# Patient Record
Sex: Female | Born: 1960 | Hispanic: Yes
Health system: Southern US, Community
[De-identification: ages and names within clinical notes are randomized; demographics above are authoritative.]

---

## 2015-03-02 ENCOUNTER — Inpatient Hospital Stay: Payer: Self-pay | Admitting: Cardiology

## 2015-04-29 NOTE — Discharge Summary (Signed)
PATIENT NAME:  [name] Todd, [name] MR#:  [id number] DATE OF BIRTH:  1961/06/29  DATE OF ADMISSION:  03/02/2015 DATE OF DISCHARGE:  03/04/2015  PR[facility]ARE [facility]YSIC[facility].  FINAL DIAGNOSIS: Inferior ST-elevation myocardial infarction.  DISCHARGE MEDICATIONS: Included aspirin 325 mg daily, clopidogrel 75 mg daily, lisinopril 5 mg daily, Simvastatin 20 mg at bedtime, metoprolol succinate 50 mg daily, nitroglycerin 0.4 mg sublingual p.r.n.   PROCEDURES: Cardiac catheterization with selective coronary arteriography on 03/02/2015.   HISTORY OF PRESENT ILLNESS: The please see admission H and P.   HOSPITAL COURSE: The patient [facility] Room on 03/02/2015, with substernal chest pain. EKG revealed ST elevations in the inferior leads consistent with ST-elevation myocardial infarction. The patient underwent urgent cardiac catheterization which revealed insignificant coronary artery disease with a near 60% tubular stenosis in the mid to distal segment of a small caliber posterior descending artery. The patient was treated medically. She ruled in for myocardial infarction with a troponin of 13. The patient was treated with a heparin drip overnight and transferred to telemetry on 03/03/2015. Following cardiac catheterization, the patient had no recurrent chest pain. Echocardiogram was performed, which revealed preserved left ventricular function with LV ejection fraction greater than 55% without significant valvular abnormalities. The patient ambulated in the halls without difficulty. On the morning of 03/04/2015, the patient was discharged home.    ____________________________ Marcina MillardAlexander Wister Hoefle, MD ap:jh D: 03/04/2015 08:56:08 ET T: 03/04/2015 12:53:24 ET JOB#: [id number]  cc: Marcina MillardAlexander Dezman Granda, MD, <Dictator> Marcina MillardALEXANDER Lillieanna Tuohy MD ELECTRONICALLY SIGNED 03/06/2015 10:09

## 2015-04-29 NOTE — H&P (Signed)
PATIENT NAME:  [name] Todd, [name] Todd MR#:  [id number] DATE OF BIRTH:  1961-04-15  DATE OF ADMISSION:  03/02/2015  PRIMARY CARE PHYSICIAN: [facility].  CHIEF COMPLAINT: Chest pain.   HISTORY OF PRESENT ILLNESS: The patient is a 54 year old female, who was in her usual state of health until today when she experienced chest pain. The patient describes substernal chest discomfort, feels like tightness or heaviness with radiation down her right arm. She experienced chest pain while driving to work. At work, the EMS was called, and initial EKG revealed ST elevation in inferior leads, consistent with ST elevation myocardial infarction. The patient was [facility] where she was still experiencing chest pain.   PAST MEDICAL HISTORY: None.   MEDICATIONS: Herbal supplements.   SOCIAL HISTORY: The patient is married. She denies tobacco abuse.   FAMILY HISTORY: No family history for coronary artery disease or myocardial infarction.   REVIEW OF SYSTEMS:   CONSTITUTIONAL: No fever or chills.  EYES: No blurry vision.  EARS: No hearing loss.  RESPIRATORY: No shortness of breath.  CARDIOVASCULAR: Chest pain as described above.  GASTROINTESTINAL: No nausea, vomiting, or diarrhea.  GENITOURINARY: No dysuria or hematuria.  ENDOCRINE: No polyuria or polydipsia.  MUSCULOSKELETAL: No arthralgias or myalgias.  NEUROLOGIC: No focal muscle weakness or numbness.  PSYCHOLOGICAL: No depression or anxiety.   PHYSICAL EXAMINATION:  HEENT: Pupils equal and reactive to light and accommodation.  NECK: Supple without thyromegaly.  LUNGS: Clear.  HEART: Normal JVP. Normal PMI. Regular rate and rhythm. Normal S1, S2. No appreciable gallop, murmur, or rub.  ABDOMEN: Soft and nontender. Pulses were intact bilaterally.  MUSCULOSKELETAL: Normal muscle tone.  NEUROLOGIC: The patient is alert and oriented x 3. Motor and sensory both grossly intact.   IMPRESSION: A 54 year old female with no  cardiovascular risk factors with new onset chest pain with EKG diagnostic of inferior ST elevation myocardial infarction.   RECOMMENDATIONS: 1. Heparin bolus 4000 units.  2. Aspirin.  3. Proceed directly to cardiac catheterization for coronary arteriography and possible primary PCI. The risks, benefits, and alternatives were explained, and informed written consent obtained.    ____________________________ Marcina MillardAlexander Wood Novacek, MD ap:JT D: 03/02/2015 09:49:12 ET T: 03/02/2015 10:02:30 ET JOB#: [id number]  cc: Marcina MillardAlexander Vonn Sliger, MD, <Dictator>  Marcina MillardALEXANDER Deshun Sedivy MD ELECTRONICALLY SIGNED 03/06/2015 10:09

## 2016-04-01 IMAGING — CR DG CHEST 1V PORT
1 series · 1 of 1 positions shown · non-contrast
Comparison: None.

CLINICAL DATA: Acute myocardial infarct

EXAM:
PORTABLE CHEST - 1 VIEW

[ap]
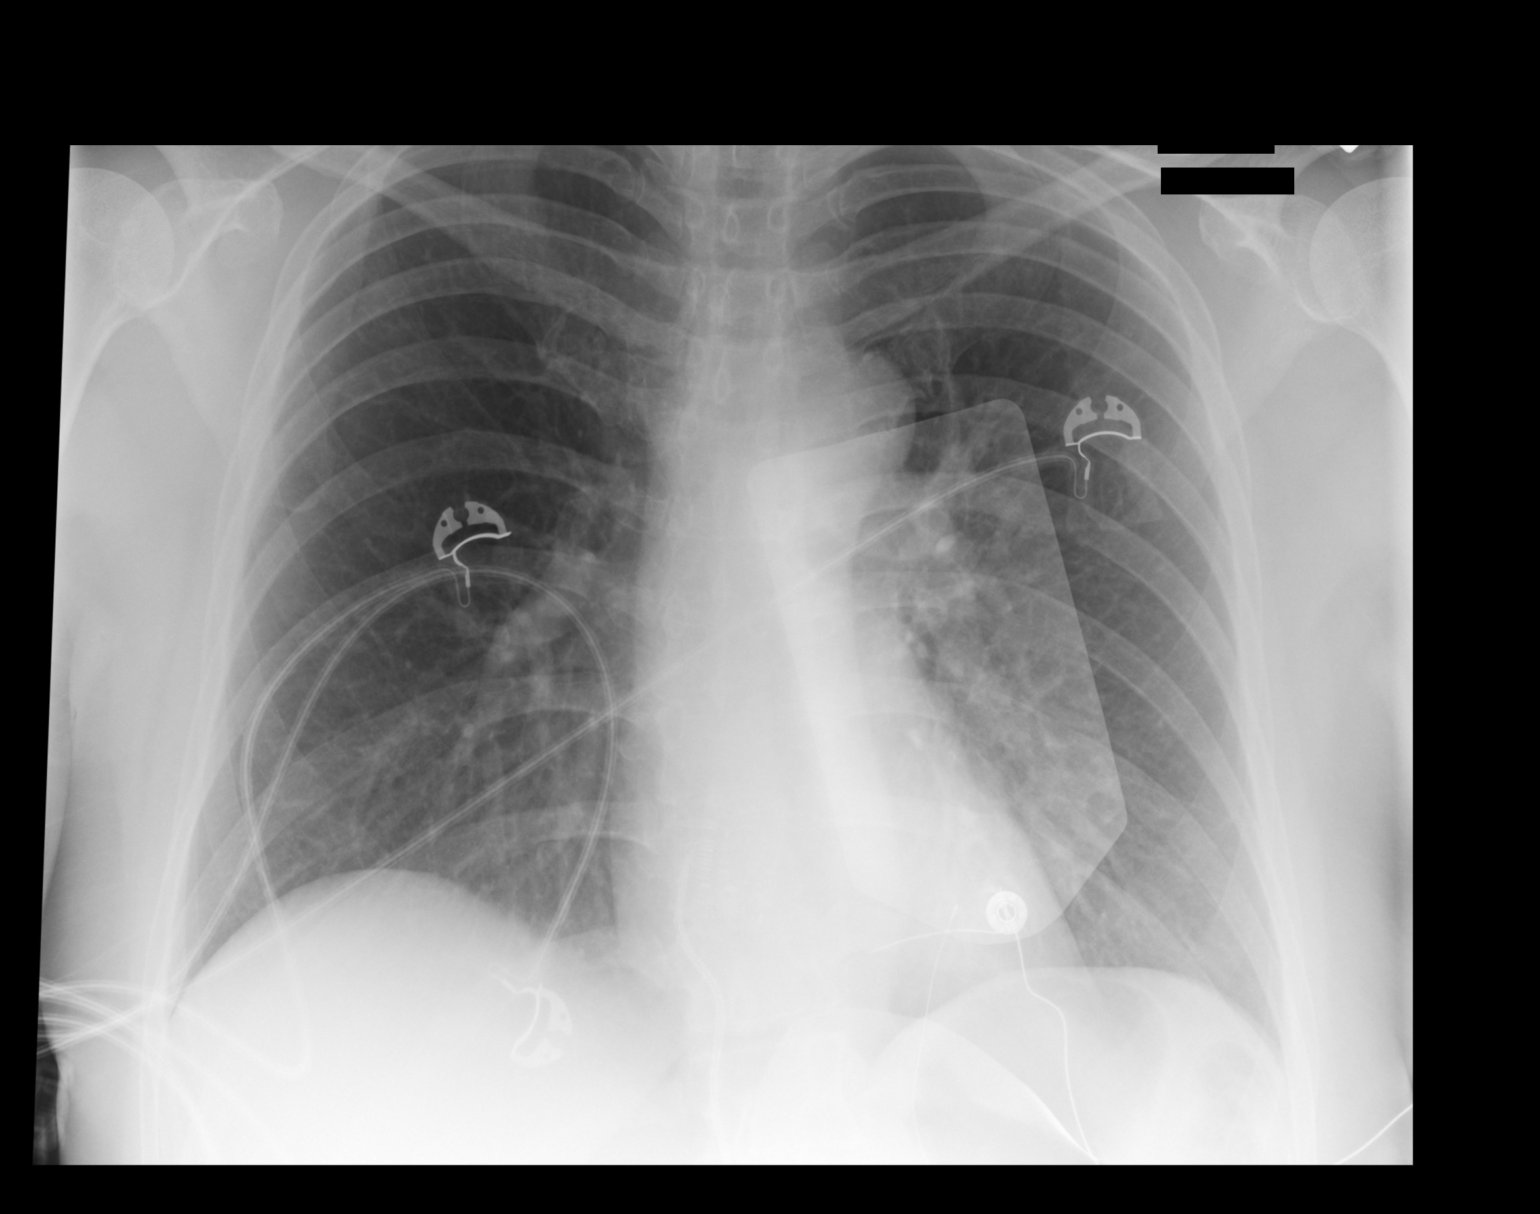

[1 of 1 positions shown; findings below may reference images not displayed]

FINDINGS: Lungs are clear. Heart size and pulmonary vascularity are normal. No
adenopathy. No pneumothorax. No bone lesions.
IMPRESSION: No edema or consolidation.
